# Patient Record
Sex: Female | Born: 2000 | Race: White | Hispanic: No | Marital: Single | Smoking: Never smoker
Health system: Southern US, Community
[De-identification: ages and names within clinical notes are randomized; demographics above are authoritative.]

---

## 2019-05-09 ENCOUNTER — Emergency Department: Payer: BLUE CROSS/BLUE SHIELD

## 2019-05-09 ENCOUNTER — Other Ambulatory Visit: Payer: Self-pay

## 2019-05-09 ENCOUNTER — Emergency Department
Admission: EM | Admit: 2019-05-09 | Discharge: 2019-05-09 | Disposition: A | Discharge status: Home / Self Care | Payer: BLUE CROSS/BLUE SHIELD | Attending: Emergency Medicine | Admitting: Emergency Medicine

## 2019-05-09 DIAGNOSIS — R519 Headache, unspecified: Secondary | ICD-10-CM | POA: Diagnosis not present

## 2019-05-09 NOTE — ED Notes (Signed)
Pt experienced left sided facial and arm numbness along w/ headache and blurred vision. Pt had been doing schoolwork on the computer for 5 hours prior to episode. Pt denies LOC.

## 2019-05-09 NOTE — ED Notes (Signed)
Negative urine pregnancy at this time

## 2019-05-09 NOTE — ED Notes (Signed)
No orders at this time per MD [facility]. MD reported patient should be fine for flexcare.

## 2019-05-09 NOTE — ED Provider Notes (Signed)
Emergency Department Provider Note  ____________________________________________  Time seen: Approximately 8:21 PM  I have reviewed the triage vital signs and the nursing notes.   HISTORY  Chief Complaint Headache and Numbness   Historian Patient     HPI Christine Huber is a 19 y.o. female presents to the emergency department with concern for possible migraine.  Patient states that she has been staring at her computer screen for several hours while taking a test.  She states that she started to see small spots in her vision and then developed a headache.  She states that she took 2 ibuprofen and noticed some numbness and tingling around her mouth and along her left upper extremity which caused her to be concerned.  She denies weakness in the upper and lower extremities.  She states that she has never had a headache in the past and does not have generalized anxiety or panic attacks.  No chest pain, chest tightness or abdominal pain.  She reports that her headache is resolved as well as her other symptoms.   History reviewed. No pertinent past medical history.   Immunizations up to date:  Yes.     History reviewed. No pertinent past medical history.  There are no problems to display for this patient.   History reviewed. No pertinent surgical history.  Prior to Admission medications   Not on File    Allergies Patient has no known allergies.  No family history on file.  Social History Social History   Tobacco Use  . Smoking status: Never Smoker  . Smokeless tobacco: Never Used  Substance Use Topics  . Alcohol use: Yes  . Drug use: Not on file     Review of Systems  Constitutional: No fever/chills Eyes:  No discharge ENT: No upper respiratory complaints. Respiratory: no cough. No SOB/ use of accessory muscles to breath Gastrointestinal:   No nausea, no vomiting.  No diarrhea.  No constipation. Musculoskeletal: Negative for musculoskeletal pain. Skin:  Negative for rash, abrasions, lacerations, ecchymosis.    ____________________________________________   PHYSICAL EXAM:  VITAL SIGNS: ED Triage Vitals  Enc Vitals Group     BP 05/09/19 1911 (!) 152/100     Pulse Rate 05/09/19 1911 87     Resp 05/09/19 1911 18     Temp 05/09/19 1911 98.6 F (37 C)     Temp src --      SpO2 05/09/19 1911 100 %     Weight 05/09/19 1912 120 lb (54.4 kg)     Height 05/09/19 1912 5\' 4"  (1.626 m)     Head Circumference --      Peak Flow --      Pain Score 05/09/19 1912 0     Pain Loc --      Pain Edu? --      Excl. in [place]? --      Constitutional: Alert and oriented. Well appearing and in no acute distress. Eyes: Conjunctivae are normal. PERRL. EOMI. Head: Atraumatic. ENT:      Nose: No congestion/rhinnorhea.      Mouth/Throat: Mucous membranes are moist.  Neck: No stridor.  Full range of motion. Cardiovascular: Normal rate, regular rhythm. Normal S1 and S2.  Good peripheral circulation. Respiratory: Normal respiratory effort without tachypnea or retractions. Lungs CTAB. Good air entry to the bases with no decreased or absent breath sounds Gastrointestinal: Bowel sounds x 4 quadrants. Soft and nontender to palpation. No guarding or rigidity. No distention. Musculoskeletal: Patient has 5 out of 5  strength in the upper and lower extremities.  Full range of motion to all extremities. No obvious deformities noted Neurologic: Cranial nerves II through XII are intact.  Patient is able to perform rapid alternating movements.  Negative Romberg.  Normal for age. No gross focal neurologic deficits are appreciated.  Skin:  Skin is warm, dry and intact. No rash noted. Psychiatric: Mood and affect are normal for age. Speech and behavior are normal.   ____________________________________________   LABS (all labs ordered are listed, but only abnormal results are displayed)  Labs Reviewed  POC URINE PREG, ED    ____________________________________________  EKG   ____________________________________________  RADIOLOGY Geraldo Pitter, personally viewed and evaluated these images (plain radiographs) as part of my medical decision making, as well as reviewing the written report by the radiologist.    CT Head Wo Contrast  Result Date: 05/09/2019 CLINICAL DATA:  Headache. Blurry vision. Numbness down left arm. EXAM: CT HEAD WITHOUT CONTRAST TECHNIQUE: Contiguous axial images were obtained from the base of the skull through the vertex without intravenous contrast. COMPARISON:  None. FINDINGS: Brain: No intracranial hemorrhage, mass effect, or midline shift. No hydrocephalus. The basilar cisterns are patent. No evidence of territorial infarct or acute ischemia. No extra-axial or intracranial fluid collection. Vascular: No hyperdense vessel or unexpected calcification. Skull: No fracture or focal lesion. Sinuses/Orbits: Paranasal sinuses and mastoid air cells are clear. The visualized orbits are unremarkable. Other: None. IMPRESSION: Negative noncontrast head CT. Electronically Signed   By: Narda Rutherford M.D.   On: 05/09/2019 20:44    ____________________________________________    PROCEDURES  Procedure(s) performed:     Procedures     Medications - No data to display   ____________________________________________   INITIAL IMPRESSION / ASSESSMENT AND PLAN / ED COURSE  Pertinent labs & imaging results that were available during my care of the patient were reviewed by me and considered in my medical decision making (see chart for details).      Assessment and Plan: Headache 19 year old female presents to the emergency department after experiencing some tingling around her mouth in the left upper extremity after she developed a headache.  Patient was hypertensive at triage but vital signs were otherwise reassuring.  Neuro exam was without acute deficits.  CT head revealed no  acute abnormality.  Tylenol and ibuprofen alternating were recommended for headache of headache reoccurs.  Return precautions were given to return with new or worsening symptoms.  ____________________________________________  FINAL CLINICAL IMPRESSION(S) / ED DIAGNOSES  Final diagnoses:  Nonintractable headache, unspecified chronicity pattern, unspecified headache type      NEW MEDICATIONS STARTED DURING THIS VISIT:  ED Discharge Orders    None          This chart was dictated using voice recognition software/Dragon. Despite best efforts to proofread, errors can occur which can change the meaning. Any change was purely unintentional.     Orvil Feil, PA-C 05/09/19 2056    Emily Filbert, MD 05/09/19 [phone]

## 2019-05-09 NOTE — ED Triage Notes (Signed)
Patient reports approx 90 minutes ago she began to have a headache, blurred vision, and numbness down left face and arm. Patient reports at this time all symptoms have resolved.

## 2019-05-12 LAB — POCT PREGNANCY, URINE: Preg Test, Ur: NEGATIVE

## 2020-08-06 IMAGING — CT CT HEAD W/O CM
3 series · 16 of 44 positions shown, 19 images · non-contrast
Comparison: None.

CLINICAL DATA: Headache. Blurry vision. Numbness down left arm.

EXAM:
CT HEAD WITHOUT CONTRAST
TECHNIQUE: Contiguous axial images were obtained from the base of the skull
through the vertex without intravenous contrast.

[Series 3: head wo · axial · 0.40mm/px · z∈[-128,-18]mm · 10 of 27 slices shown, 13 images]
[im 3/27  brain]
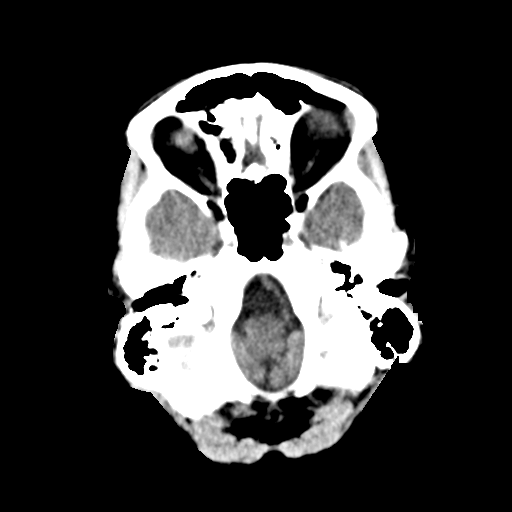
[im 3/27  bone]
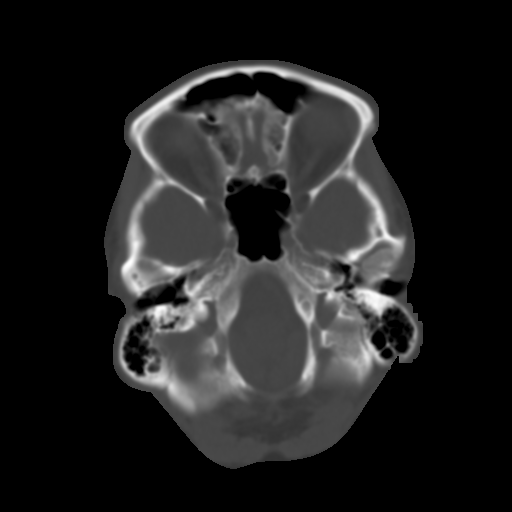
[im 5/27  brain]
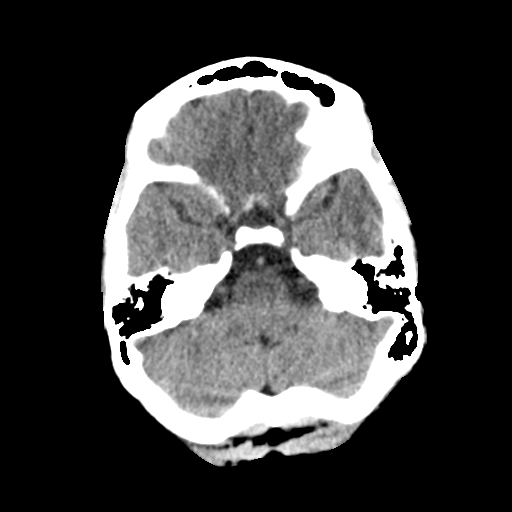
[im 8/27  brain]
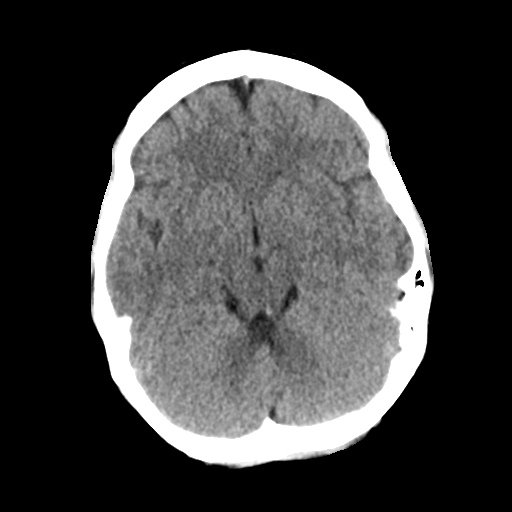
[im 10/27  brain]
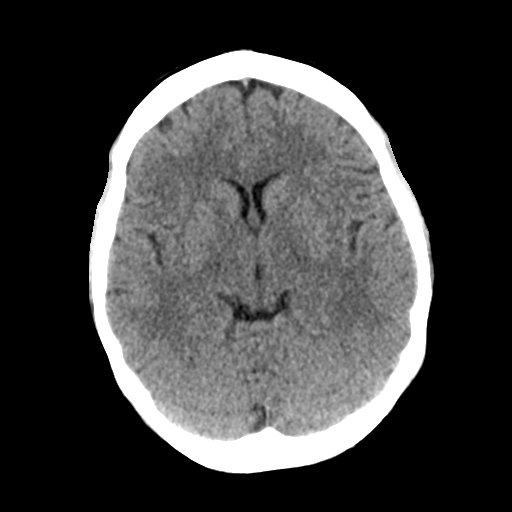
[im 13/27  brain]
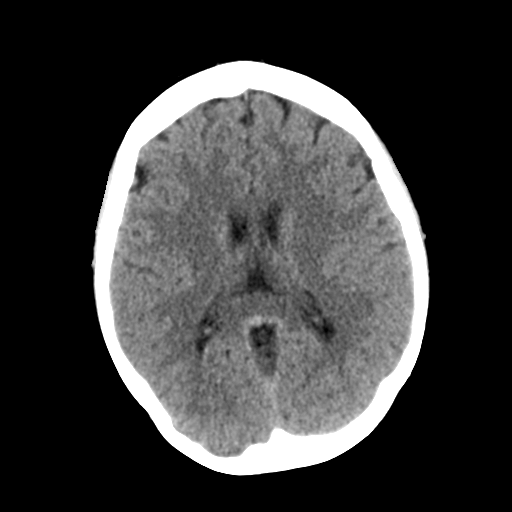
[im 13/27  bone]
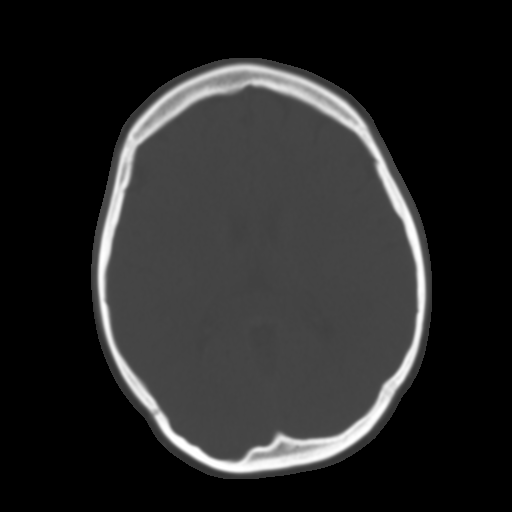
[im 15/27  brain]
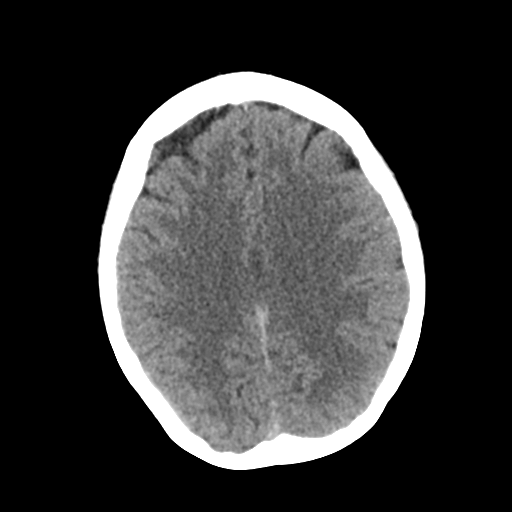
[im 18/27  brain]
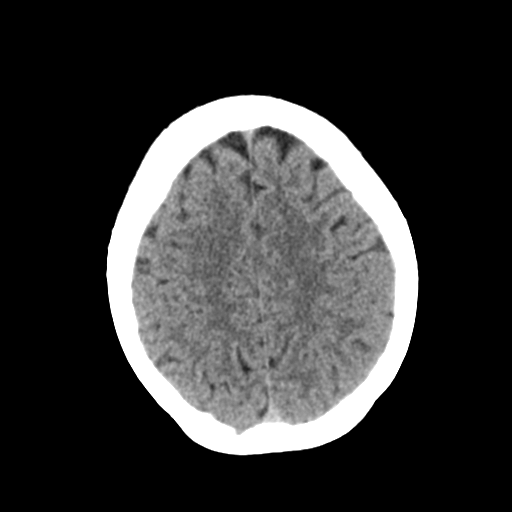
[im 20/27  brain]
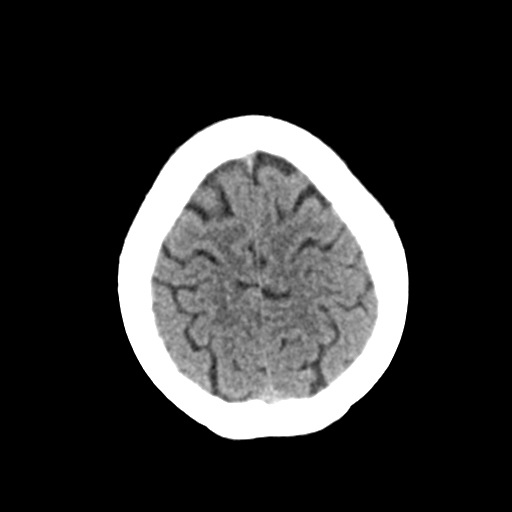
[im 23/27  brain]
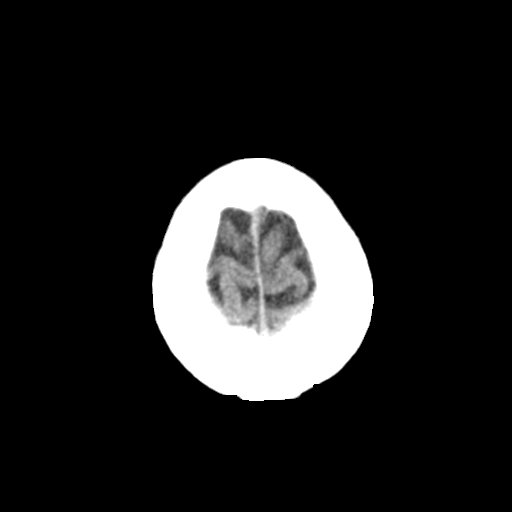
[im 23/27  bone]
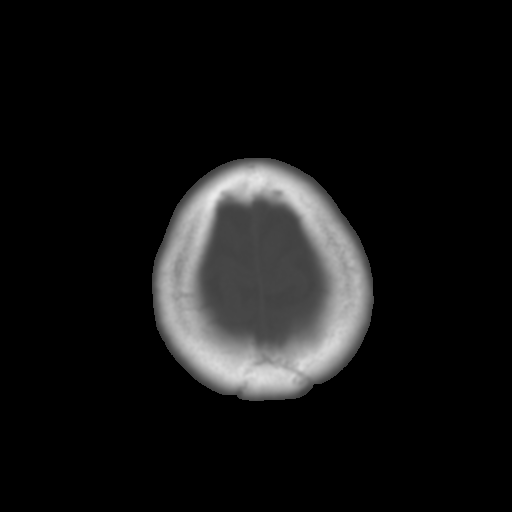
[im 25/27  brain]
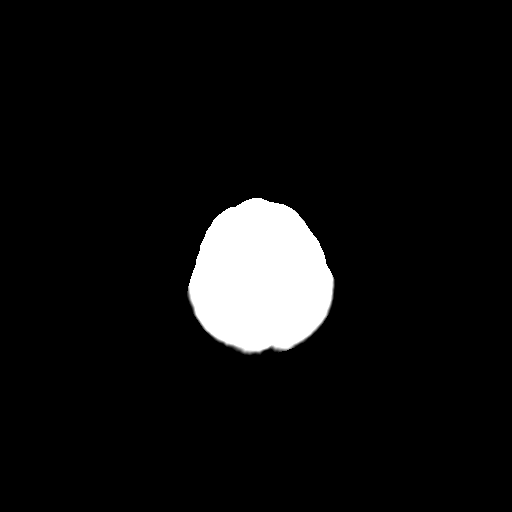

[Series 4: coronal soft tissue · coronal · 0.32mm/px · 3 of 62 slices shown]
[im 21/62  brain]
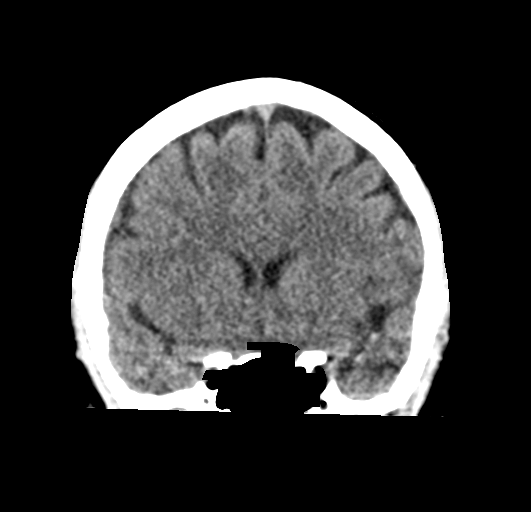
[im 28/62  brain]
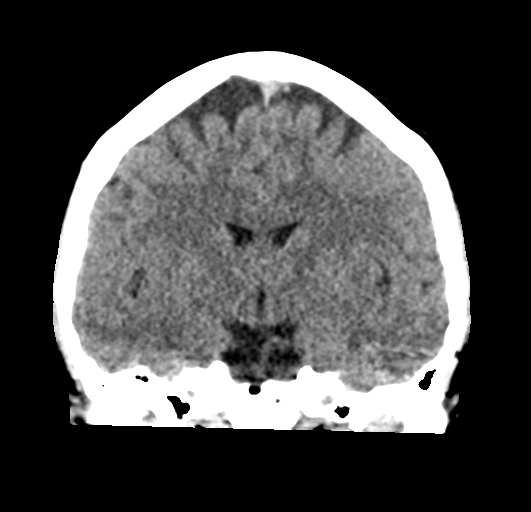
[im 34/62  brain]
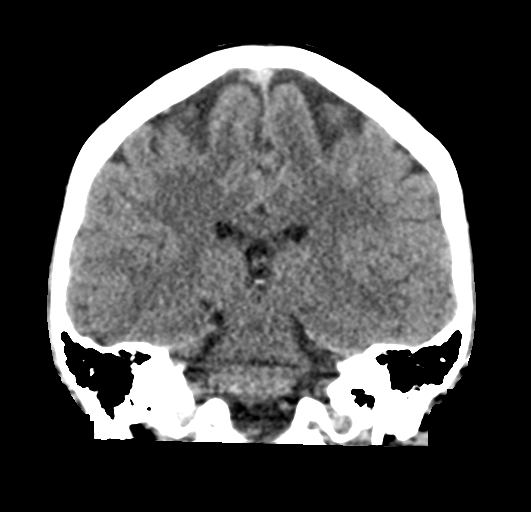

[Series 5: sagittal soft tissue · sagittal · 0.32mm/px · 3 of 50 slices shown]
[im 17/50  brain]
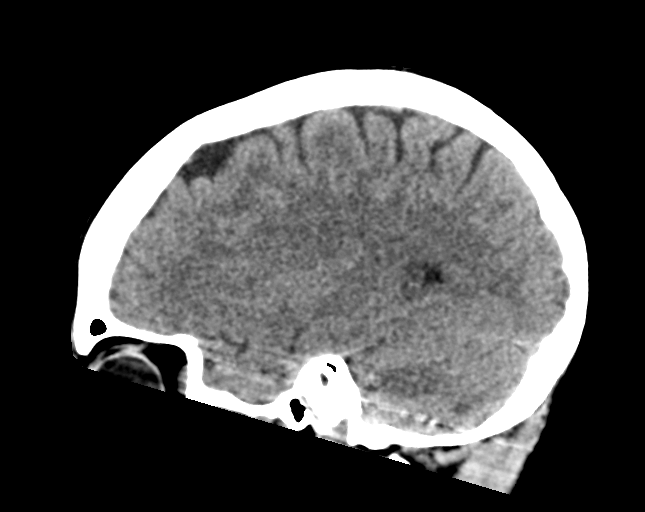
[im 25/50  brain]
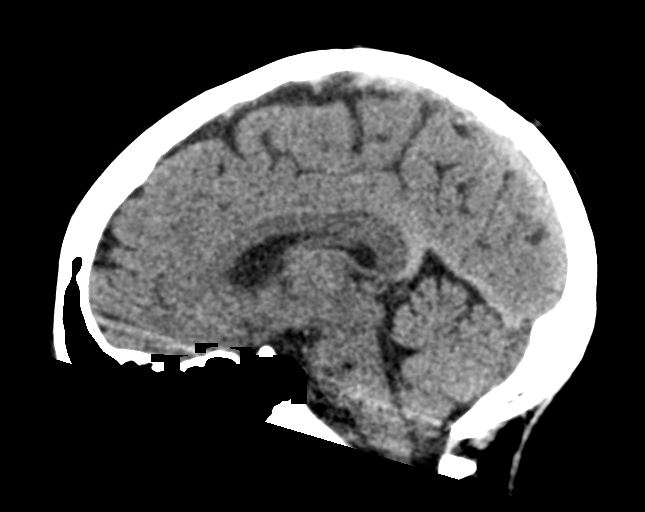
[im 33/50  brain]
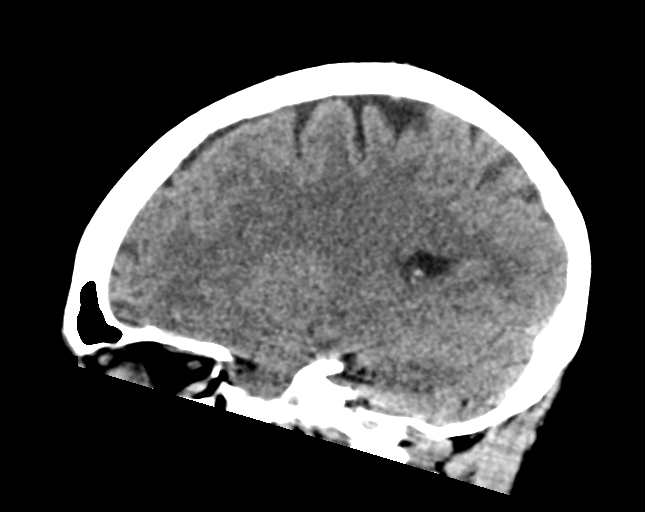

[16 of 44 positions shown; findings below may reference images not displayed]

FINDINGS: Brain: No intracranial hemorrhage, mass effect, or midline shift. No
hydrocephalus. The basilar cisterns are patent. No evidence of
territorial infarct or acute ischemia. No extra-axial or
intracranial fluid collection.

Vascular: No hyperdense vessel or unexpected calcification.

Skull: No fracture or focal lesion.

Sinuses/Orbits: Paranasal sinuses and mastoid air cells are clear.
The visualized orbits are unremarkable.

Other: None.
IMPRESSION: Negative noncontrast head CT.
